# Patient Record
Sex: Female | Born: 1965 | Race: Black or African American | Hispanic: No | Marital: Married | Smoking: Never smoker
Health system: Southern US, Community
[De-identification: ages and names within clinical notes are randomized; demographics above are authoritative.]

## PROBLEM LIST (undated history)

## (undated) DIAGNOSIS — F419 Anxiety disorder, unspecified: Secondary | ICD-10-CM

## (undated) DIAGNOSIS — D649 Anemia, unspecified: Secondary | ICD-10-CM

## (undated) DIAGNOSIS — T7840XA Allergy, unspecified, initial encounter: Secondary | ICD-10-CM

## (undated) HISTORY — DX: Anemia, unspecified: D64.9

## (undated) HISTORY — DX: Allergy, unspecified, initial encounter: T78.40XA

## (undated) HISTORY — DX: Anxiety disorder, unspecified: F41.9

## (undated) HISTORY — PX: ABDOMINAL HYSTERECTOMY: SHX81

---

## 1999-05-14 HISTORY — PX: GASTRIC BYPASS: SHX52

## 2013-11-11 DIAGNOSIS — I10 Essential (primary) hypertension: Secondary | ICD-10-CM | POA: Insufficient documentation

## 2014-03-29 ENCOUNTER — Ambulatory Visit: Payer: Self-pay | Admitting: Family Medicine

## 2014-03-29 LAB — CBC WITH DIFFERENTIAL/PLATELET
Basophil #: 0 10*3/uL (ref 0.0–0.1)
Basophil %: 0.6 %
Eosinophil #: 0.1 10*3/uL (ref 0.0–0.7)
Eosinophil %: 1.8 %
HCT: 41.5 % (ref 35.0–47.0)
HGB: 13.5 g/dL (ref 12.0–16.0)
Lymphocyte #: 2.2 10*3/uL (ref 1.0–3.6)
Lymphocyte %: 28 %
MCH: 30 pg (ref 26.0–34.0)
MCHC: 32.6 g/dL (ref 32.0–36.0)
MCV: 92 fL (ref 80–100)
MONO ABS: 0.6 x10 3/mm (ref 0.2–0.9)
MONOS PCT: 8.2 %
Neutrophil #: 4.7 10*3/uL (ref 1.4–6.5)
Neutrophil %: 61.4 %
PLATELETS: 291 10*3/uL (ref 150–440)
RBC: 4.51 10*6/uL (ref 3.80–5.20)
RDW: 13.2 % (ref 11.5–14.5)
WBC: 7.7 10*3/uL (ref 3.6–11.0)

## 2014-03-29 LAB — LIPID PANEL
CHOLESTEROL: 145 mg/dL (ref 0–200)
HDL Cholesterol: 58 mg/dL (ref 40–60)
Ldl Cholesterol, Calc: 77 mg/dL (ref 0–100)
Triglycerides: 50 mg/dL (ref 0–200)
VLDL Cholesterol, Calc: 10 mg/dL (ref 5–40)

## 2014-08-29 DIAGNOSIS — R002 Palpitations: Secondary | ICD-10-CM | POA: Insufficient documentation

## 2014-09-06 ENCOUNTER — Ambulatory Visit
Admit: 2014-09-06 | Disposition: A | Discharge status: Home / Self Care | Payer: Self-pay | Attending: Bariatrics | Admitting: Bariatrics

## 2014-10-26 ENCOUNTER — Other Ambulatory Visit: Payer: Self-pay | Admitting: Bariatrics

## 2014-11-03 ENCOUNTER — Ambulatory Visit
Admission: RE | Admit: 2014-11-03 | Discharge: 2014-11-03 | Disposition: A | Discharge status: Home / Self Care | Payer: BLUE CROSS/BLUE SHIELD | Source: Ambulatory Visit | Attending: Bariatrics | Admitting: Bariatrics

## 2014-11-03 DIAGNOSIS — K219 Gastro-esophageal reflux disease without esophagitis: Secondary | ICD-10-CM | POA: Insufficient documentation

## 2014-11-03 DIAGNOSIS — Z9884 Bariatric surgery status: Secondary | ICD-10-CM | POA: Insufficient documentation

## 2014-11-24 ENCOUNTER — Ambulatory Visit (INDEPENDENT_AMBULATORY_CARE_PROVIDER_SITE_OTHER): Payer: BLUE CROSS/BLUE SHIELD | Admitting: Family Medicine

## 2014-11-24 ENCOUNTER — Encounter: Payer: Self-pay | Admitting: Family Medicine

## 2014-11-24 VITALS — BP 110/60 | HR 95 | Temp 98.8°F | Resp 17 | Ht 65.0 in | Wt 240.7 lb

## 2014-11-24 DIAGNOSIS — F419 Anxiety disorder, unspecified: Secondary | ICD-10-CM

## 2014-11-24 DIAGNOSIS — F411 Generalized anxiety disorder: Secondary | ICD-10-CM

## 2014-11-24 DIAGNOSIS — R011 Cardiac murmur, unspecified: Secondary | ICD-10-CM | POA: Insufficient documentation

## 2014-11-24 DIAGNOSIS — F430 Acute stress reaction: Principal | ICD-10-CM | POA: Insufficient documentation

## 2014-11-24 MED ORDER — CLONAZEPAM 0.5 MG PO TABS: 0.2500 mg | ORAL_TABLET | Freq: Two times a day (BID) | ORAL | Status: DC | PRN

## 2014-11-24 NOTE — Progress Notes (Signed)
Name: Consepcion Hearingunice Denise Whitaker   MRN: [id number]    DOB: January 26, 1966   Date:11/24/2014       Progress Note  Subjective  Chief Complaint  Chief Complaint  Patient presents with  . Follow-up    3 mo.  . Anxiety    Anxiety Presents for follow-up visit. Onset was 1 to 6 months ago. Symptoms include excessive worry and nervous/anxious behavior. Patient reports no chest pain, insomnia, irritability, panic or shortness of breath. Primary symptoms comment: started after daughter passed away. The severity of symptoms is mild. The symptoms are aggravated by family issues. The quality of sleep is fair.   Risk factors include a major life event. Past treatments include benzodiazephines. The treatment provided significant relief. Compliance with prior treatments has been good.      Past Medical History  Diagnosis Date  . Allergy   . Anemia   . Anxiety     Past Surgical History  Procedure Laterality Date  . Abdominal hysterectomy    . Cesarean section  1997  . Gastric bypass  2001    Family History  Problem Relation Age of Onset  . Hypertension Mother   . Multiple sclerosis Mother   . Hypertension Father   . Cancer Father   . Multiple sclerosis Sister   . Hypertension Sister   . Hypertension Brother     History   Social History  . Marital Status: Married    Spouse Name: N/A  . Number of Children: N/A  . Years of Education: N/A   Occupational History  . Not on file.   Social History Main Topics  . Smoking status: Never Smoker   . Smokeless tobacco: Never Used  . Alcohol Use: No  . Drug Use: No  . Sexual Activity:    Partners: Male    Birth Control/ Protection: None     Comment: Married   Other Topics Concern  . Not on file   Social History Narrative  . No narrative on file     Current outpatient prescriptions:  .  IRON, IRON,, Take by mouth., Disp: , Rfl:  .  Pediatric Multiple Vitamins (CHEWABLE MULTIPLE VITAMINS PO), Take by mouth., Disp: , Rfl:   Allergies   Allergen Reactions  . Aspirin Hives and Nausea And Vomiting     Review of Systems  Constitutional: Negative for irritability.  Respiratory: Negative for shortness of breath.   Cardiovascular: Negative for chest pain.  Psychiatric/Behavioral: Negative for depression. The patient is nervous/anxious. The patient does not have insomnia.       Objective  Filed Vitals:   11/24/14 1614  BP: 110/60  Pulse: 95  Temp: 98.8 F (37.1 C)  TempSrc: Oral  Resp: 17  Height: 5\' 5"  (1.651 m)  Weight: 240 lb 11.2 oz (109.181 kg)  SpO2: 99%    Physical Exam  Constitutional: She is oriented to person, place, and time and well-developed, well-nourished, and in no distress.  Cardiovascular: Normal rate, regular rhythm, S1 normal and S2 normal.   Murmur heard.  Systolic murmur is present with a grade of 3/6  Pulmonary/Chest: Effort normal and breath sounds normal.  Neurological: She is alert and oriented to person, place, and time.  Skin: Skin is warm and dry.  Psychiatric: Mood, memory, affect and judgment normal.  Nursing note and vitals reviewed.     Assessment & Plan 1. Anxiety as acute reaction to exceptional stress Symptoms of anxiety are stable on as needed benzodiazepine therapy. Patient is aware of  the dependence potential of benzodiazepines and is taking the medication as directed and only as needed. Refills provided. Follow-up in 3 months. - clonazePAM (KLONOPIN) 0.5 MG tablet; Take 0.5 tablets (0.25 mg total) by mouth 2 (two) times daily as needed for anxiety.  Dispense: 30 tablet; Refill: 0   Jodel Mayhall Asad A[facility] 11/24/2014 4:41 PM

## 2015-02-24 ENCOUNTER — Ambulatory Visit: Payer: BLUE CROSS/BLUE SHIELD | Admitting: Family Medicine

## 2015-03-06 ENCOUNTER — Ambulatory Visit
Admission: RE | Admit: 2015-03-06 | Discharge: 2015-03-06 | Disposition: A | Discharge status: Home / Self Care | Payer: BLUE CROSS/BLUE SHIELD | Source: Ambulatory Visit | Attending: Family Medicine | Admitting: Family Medicine

## 2015-03-06 ENCOUNTER — Encounter: Payer: Self-pay | Admitting: Family Medicine

## 2015-03-06 ENCOUNTER — Ambulatory Visit (INDEPENDENT_AMBULATORY_CARE_PROVIDER_SITE_OTHER): Payer: BLUE CROSS/BLUE SHIELD | Admitting: Family Medicine

## 2015-03-06 VITALS — BP 112/70 | HR 82 | Temp 98.0°F | Resp 16 | Wt 241.5 lb

## 2015-03-06 DIAGNOSIS — E668 Other obesity: Secondary | ICD-10-CM | POA: Insufficient documentation

## 2015-03-06 DIAGNOSIS — M1711 Unilateral primary osteoarthritis, right knee: Secondary | ICD-10-CM | POA: Insufficient documentation

## 2015-03-06 DIAGNOSIS — I10 Essential (primary) hypertension: Secondary | ICD-10-CM | POA: Insufficient documentation

## 2015-03-06 DIAGNOSIS — F419 Anxiety disorder, unspecified: Secondary | ICD-10-CM | POA: Diagnosis not present

## 2015-03-06 DIAGNOSIS — M25461 Effusion, right knee: Secondary | ICD-10-CM | POA: Diagnosis not present

## 2015-03-06 DIAGNOSIS — Z01419 Encounter for gynecological examination (general) (routine) without abnormal findings: Secondary | ICD-10-CM | POA: Insufficient documentation

## 2015-03-06 DIAGNOSIS — M25561 Pain in right knee: Secondary | ICD-10-CM

## 2015-03-06 DIAGNOSIS — Z1389 Encounter for screening for other disorder: Secondary | ICD-10-CM | POA: Insufficient documentation

## 2015-03-06 DIAGNOSIS — E669 Obesity, unspecified: Secondary | ICD-10-CM | POA: Insufficient documentation

## 2015-03-06 DIAGNOSIS — I499 Cardiac arrhythmia, unspecified: Secondary | ICD-10-CM | POA: Insufficient documentation

## 2015-03-06 MED ORDER — CLONAZEPAM 0.5 MG PO TABS: 0.2500 mg | ORAL_TABLET | Freq: Two times a day (BID) | ORAL | Status: DC | PRN

## 2015-03-06 MED ORDER — SERTRALINE HCL 25 MG PO TABS: 25.0000 mg | ORAL_TABLET | Freq: Every day | ORAL | Status: DC

## 2015-03-06 NOTE — Progress Notes (Signed)
Name: Tiffany Whitaker   MRN: [id number]    DOB: 1965-08-04   Date:03/06/2015       Progress Note  Subjective  Chief Complaint  Chief Complaint  Patient presents with  . Anxiety    patient is here for her 80-month follow-up  . Knee Pain    patient also mentioned about bilateral knee pain for 3 weeks. patient stated that she has been hearing some popping.    Anxiety Presents for follow-up visit. Symptoms include insomnia and nervous/anxious behavior. Patient reports no depressed mood. Primary symptoms comment: crying, . The symptoms are aggravated by family issues.   There is no history of depression. (Pt. 's young daughter passed away in Oct 06, 2022 and she has been feeling sad and grieving for her child) Past treatments include benzodiazephines. Compliance with prior treatments has been good (pt. has good days and bad days, she takes Clonazepam on bad days).  Knee Pain  There was no injury mechanism. The pain is present in the right knee. The quality of the pain is described as aching. The pain is at a severity of 10/10. The pain is severe. Pertinent negatives include no inability to bear weight, loss of motion or loss of sensation. The symptoms are aggravated by weight bearing (hurts at night when she lies down). She has tried ice and NSAIDs for the symptoms. The treatment provided moderate relief.    Past Medical History  Diagnosis Date  . Allergy   . Anemia   . Anxiety     Past Surgical History  Procedure Laterality Date  . Abdominal hysterectomy    . Cesarean section  1997  . Gastric bypass  10/06/1999    Family History  Problem Relation Age of Onset  . Hypertension Mother   . Multiple sclerosis Mother   . Hypertension Father   . Cancer Father   . Multiple sclerosis Sister   . Hypertension Sister   . Hypertension Brother     Social History   Social History  . Marital Status: Married    Spouse Name: N/A  . Number of Children: N/A  . Years of Education: N/A    Occupational History  . Not on file.   Social History Main Topics  . Smoking status: Never Smoker   . Smokeless tobacco: Never Used  . Alcohol Use: No  . Drug Use: No  . Sexual Activity:    Partners: Male    Birth Control/ Protection: None     Comment: Married   Other Topics Concern  . Not on file   Social History Narrative    Current outpatient prescriptions:  .  clonazePAM (KLONOPIN) 0.5 MG tablet, Take 0.5 tablets (0.25 mg total) by mouth 2 (two) times daily as needed for anxiety., Disp: 30 tablet, Rfl: 0 .  IRON, IRON,, Take by mouth., Disp: , Rfl:  .  Pediatric Multiple Vitamins (CHEWABLE MULTIPLE VITAMINS PO), Take by mouth., Disp: , Rfl:   Allergies  Allergen Reactions  . Aspirin Hives and Nausea And Vomiting     Review of Systems  Constitutional: Negative for fever, chills and weight loss.  Musculoskeletal: Positive for joint pain.  Psychiatric/Behavioral: Negative for depression. The patient is nervous/anxious and has insomnia.    Objective  Filed Vitals:   03/06/15 1117  BP: 112/70  Pulse: 82  Temp: 98 F (36.7 C)  TempSrc: Oral  Resp: 16  Weight: 241 lb 8 oz (109.544 kg)  SpO2: 98%    Physical Exam  Constitutional: She  is oriented to person, place, and time and well-developed, well-nourished, and in no distress.  Cardiovascular: Normal rate and regular rhythm.   Murmur heard.  Systolic murmur is present with a grade of 3/6  Pulmonary/Chest: Effort normal and breath sounds normal. She has no wheezes. She has no rales.  Musculoskeletal:       Right knee: She exhibits normal range of motion and no swelling. Tenderness found.  Tenderness over the superior portion of right knee  Neurological: She is alert and oriented to person, place, and time.  Nursing note and vitals reviewed.  Assessment & Plan  1. Right knee pain  - DG Knee Complete 4 Views Right; Future  2. Anxiety Patient leaving for her young daughter who recently passed away. We  will add low-dose Zoloft and continue clonazepam. Follow-up in 4 weeks. - clonazePAM (KLONOPIN) 0.5 MG tablet; Take 0.5 tablets (0.25 mg total) by mouth 2 (two) times daily as needed for anxiety.  Dispense: 30 tablet; Refill: 0 - sertraline (ZOLOFT) 25 MG tablet; Take 1 tablet (25 mg total) by mouth daily.  Dispense: 30 tablet; Refill: 0   Nevaeh Casillas Asad A. [facility] 03/06/2015 12:00 PM

## 2015-03-09 ENCOUNTER — Other Ambulatory Visit
Admission: RE | Admit: 2015-03-09 | Discharge: 2015-03-09 | Disposition: A | Discharge status: Home / Self Care | Payer: BLUE CROSS/BLUE SHIELD | Source: Ambulatory Visit | Attending: Bariatrics | Admitting: Bariatrics

## 2015-03-09 DIAGNOSIS — R1010 Upper abdominal pain, unspecified: Secondary | ICD-10-CM | POA: Diagnosis not present

## 2015-03-09 DIAGNOSIS — I10 Essential (primary) hypertension: Secondary | ICD-10-CM | POA: Insufficient documentation

## 2015-03-09 DIAGNOSIS — D649 Anemia, unspecified: Secondary | ICD-10-CM | POA: Insufficient documentation

## 2015-03-09 LAB — CBC WITH DIFFERENTIAL/PLATELET
BASOS ABS: 0 10*3/uL (ref 0–0.1)
BASOS PCT: 1 %
EOS ABS: 0.2 10*3/uL (ref 0–0.7)
EOS PCT: 2 %
HEMATOCRIT: 38.8 % (ref 35.0–47.0)
Hemoglobin: 12.8 g/dL (ref 12.0–16.0)
Lymphocytes Relative: 26 %
Lymphs Abs: 2.6 10*3/uL (ref 1.0–3.6)
MCH: 29.1 pg (ref 26.0–34.0)
MCHC: 32.9 g/dL (ref 32.0–36.0)
MCV: 88.4 fL (ref 80.0–100.0)
MONO ABS: 1 10*3/uL — AB (ref 0.2–0.9)
MONOS PCT: 10 %
NEUTROS ABS: 6.3 10*3/uL (ref 1.4–6.5)
Neutrophils Relative %: 61 %
PLATELETS: 260 10*3/uL (ref 150–440)
RBC: 4.39 MIL/uL (ref 3.80–5.20)
RDW: 13.4 % (ref 11.5–14.5)
WBC: 10.1 10*3/uL (ref 3.6–11.0)

## 2015-03-09 LAB — COMPREHENSIVE METABOLIC PANEL
ALBUMIN: 3.5 g/dL (ref 3.5–5.0)
ALT: 18 U/L (ref 14–54)
ANION GAP: 5 (ref 5–15)
AST: 17 U/L (ref 15–41)
Alkaline Phosphatase: 88 U/L (ref 38–126)
BUN: 20 mg/dL (ref 6–20)
CHLORIDE: 105 mmol/L (ref 101–111)
CO2: 26 mmol/L (ref 22–32)
Calcium: 8.9 mg/dL (ref 8.9–10.3)
Creatinine, Ser: 0.55 mg/dL (ref 0.44–1.00)
GFR calc non Af Amer: >60 mL/min (ref >60)
GLUCOSE: 107 mg/dL — AB (ref 65–99)
POTASSIUM: 3.5 mmol/L (ref 3.5–5.1)
SODIUM: 136 mmol/L (ref 135–145)
Total Bilirubin: 0.6 mg/dL (ref 0.3–1.2)
Total Protein: 6.9 g/dL (ref 6.5–8.1)

## 2015-03-09 LAB — IRON AND TIBC
IRON: 62 ug/dL (ref 28–170)
Saturation Ratios: 19 % (ref 10.4–31.8)
TIBC: 335 ug/dL (ref 250–450)
UIBC: 273 ug/dL

## 2015-03-09 LAB — APTT: APTT: 32 s (ref 24–36)

## 2015-03-09 LAB — PROTIME-INR
INR: 0.98
PROTHROMBIN TIME: 13.2 s (ref 11.4–15.0)

## 2015-03-09 LAB — FERRITIN: Ferritin: 52 ng/mL (ref 11–307)

## 2015-03-09 LAB — TSH: TSH: 1.866 u[IU]/mL (ref 0.350–4.500)

## 2015-03-09 LAB — PREALBUMIN: PREALBUMIN: 17.5 mg/dL — AB (ref 18–38)

## 2015-03-09 LAB — FOLATE: FOLATE: 23 ng/mL (ref >5.9)

## 2015-03-09 LAB — MAGNESIUM: Magnesium: 1.8 mg/dL (ref 1.7–2.4)

## 2015-03-09 LAB — VITAMIN B12: VITAMIN B 12: 592 pg/mL (ref 180–914)

## 2015-03-10 LAB — H. PYLORI ANTIBODY, IGG: H Pylori IgG: 1.5 U/mL — ABNORMAL HIGH (ref 0.0–0.8)

## 2015-03-10 LAB — HEMOGLOBIN A1C: Hgb A1c MFr Bld: 6.3 % — ABNORMAL HIGH (ref 4.0–6.0)

## 2015-03-12 LAB — VITAMIN A: VITAMIN A (RETINOIC ACID): 30 ug/dL (ref 20–65)

## 2015-03-12 LAB — COPPER, SERUM: Copper: 133 ug/dL (ref 72–166)

## 2015-03-12 LAB — PTH, INTACT AND CALCIUM
Calcium, Total (PTH): 9.2 mg/dL (ref 8.7–10.2)
PTH: 38 pg/mL (ref 15–65)

## 2015-03-12 LAB — ZINC: ZINC: 70 ug/dL (ref 56–134)

## 2015-03-12 LAB — VITAMIN D 25 HYDROXY (VIT D DEFICIENCY, FRACTURES): Vit D, 25-Hydroxy: 25.4 ng/mL — ABNORMAL LOW (ref 30.0–100.0)

## 2015-03-12 LAB — VITAMIN E: Alpha-Tocopherol: 8.5 mg/L (ref 5.3–16.8)

## 2015-03-12 LAB — VITAMIN B1: Vitamin B1 (Thiamine): 109.4 nmol/L (ref 66.5–200.0)

## 2015-03-15 LAB — H. PYLORI BREATH TEST: H. pylori UBiT: POSITIVE — AB

## 2015-03-15 LAB — MISC LABCORP TEST (SEND OUT): Labcorp test code: 121200

## 2015-03-16 ENCOUNTER — Encounter: Payer: Self-pay | Admitting: Family Medicine

## 2015-03-16 ENCOUNTER — Ambulatory Visit (INDEPENDENT_AMBULATORY_CARE_PROVIDER_SITE_OTHER): Payer: BLUE CROSS/BLUE SHIELD | Admitting: Family Medicine

## 2015-03-16 VITALS — BP 115/70 | HR 93 | Temp 98.8°F | Resp 19 | Ht 65.0 in | Wt 239.8 lb

## 2015-03-16 DIAGNOSIS — R29818 Other symptoms and signs involving the nervous system: Secondary | ICD-10-CM | POA: Diagnosis not present

## 2015-03-16 NOTE — Progress Notes (Signed)
Name: Tiffany Whitaker   MRN: [id number]    DOB: 1966-01-15   Date:03/16/2015       Progress Note  Subjective  Chief Complaint  Chief Complaint  Patient presents with  . Follow-up    11 day check for MS symptoms    HPI  Pt. Is here for evaluation of symptoms that she thinks are from 'MS'. Her sister was recently diagnosed with MS, and pt. Is worried about some symptoms that she has experienced which has caused her to be concerned about MS. These include difficulty with balance, tingling in legs especially when driving, unusual sensation in her thighs, numbness in hands. Most recently, she has noticed that when she closes her right eye and uses her left eye only, she sees 'smokiness' These symptoms are transient (None present at this time).  Past Medical History  Diagnosis Date  . Allergy   . Anemia   . Anxiety     Past Surgical History  Procedure Laterality Date  . Abdominal hysterectomy    . Cesarean section  1997  . Gastric bypass  2001    Family History  Problem Relation Age of Onset  . Hypertension Mother   . Multiple sclerosis Mother   . Hypertension Father   . Cancer Father   . Multiple sclerosis Sister   . Hypertension Sister   . Hypertension Brother     Social History   Social History  . Marital Status: Married    Spouse Name: N/A  . Number of Children: N/A  . Years of Education: N/A   Occupational History  . Not on file.   Social History Main Topics  . Smoking status: Never Smoker   . Smokeless tobacco: Never Used  . Alcohol Use: No  . Drug Use: No  . Sexual Activity:    Partners: Male    Birth Control/ Protection: None     Comment: Married   Other Topics Concern  . Not on file   Social History Narrative     Current outpatient prescriptions:  .  clonazePAM (KLONOPIN) 0.5 MG tablet, Take 0.5 tablets (0.25 mg total) by mouth 2 (two) times daily as needed for anxiety., Disp: 30 tablet, Rfl: 0 .  IRON, IRON,, Take by mouth., Disp: , Rfl:   .  Pediatric Multiple Vitamins (CHEWABLE MULTIPLE VITAMINS PO), Take by mouth., Disp: , Rfl:  .  sertraline (ZOLOFT) 25 MG tablet, Take 1 tablet (25 mg total) by mouth daily., Disp: 30 tablet, Rfl: 0  Allergies  Allergen Reactions  . Aspirin Hives and Nausea And Vomiting    Review of Systems  Constitutional: Negative for fever, chills, weight loss and malaise/fatigue.  HENT: Negative for hearing loss and tinnitus.   Eyes: Positive for blurred vision. Negative for double vision.  Cardiovascular: Negative for chest pain and palpitations.  Musculoskeletal: Negative for back pain and neck pain.  Neurological: Positive for dizziness, tingling and sensory change. Negative for focal weakness, loss of consciousness and headaches.  Psychiatric/Behavioral: Positive for depression (' i dont think i m depressed'). The patient is nervous/anxious.     Objective  Filed Vitals:   03/16/15 1556  BP: 115/70  Pulse: 93  Temp: 98.8 F (37.1 C)  TempSrc: Oral  Resp: 19  Height:  (1.651 m)  Weight: 239 lb 12.8 oz (108.773 kg)  SpO2: 98%    Physical Exam  Constitutional: She is oriented to person, place, and time and well-developed, well-nourished, and in no distress.  HENT:  Head: Normocephalic and atraumatic.  Cardiovascular: Normal rate, regular rhythm and normal heart sounds.   Pulmonary/Chest: Effort normal and breath sounds normal.  Neurological: She is alert and oriented to person, place, and time. She has normal motor skills, normal sensation, normal strength and intact cranial nerves. She displays no weakness, normal speech and normal reflexes. No cranial nerve deficit or sensory deficit. She exhibits normal muscle tone. Coordination and gait normal.  Reflex Scores:      Patellar reflexes are 1+ on the right side and 1+ on the left side. Psychiatric: Memory, affect and judgment normal.  Nursing note and vitals reviewed.   Assessment & Plan  1. Transient neurological  symptoms Unremarkable physical exam except for mildly sluggish patellar reflexes. I have explained to the patient that I do not believe she has MS and testing with MRI is not indicated at this time. We'll obtain lab lab work and refer to neurology for further evaluation. Patient verbalized understanding. - COMPLETE METABOLIC PANEL WITH GFR - TSH - B12 - CBC with Differential - Ambulatory referral to Neurology   [facility] Asad [facility] 03/16/2015 4:22 PM

## 2015-04-18 ENCOUNTER — Other Ambulatory Visit: Payer: Self-pay | Admitting: Bariatrics

## 2015-08-16 ENCOUNTER — Ambulatory Visit
Admission: RE | Admit: 2015-08-16 | Discharge: 2015-08-16 | Disposition: A | Discharge status: Home / Self Care | Payer: BLUE CROSS/BLUE SHIELD | Source: Ambulatory Visit | Attending: Bariatrics | Admitting: Bariatrics

## 2016-05-28 ENCOUNTER — Ambulatory Visit (INDEPENDENT_AMBULATORY_CARE_PROVIDER_SITE_OTHER): Payer: Managed Care, Other (non HMO) | Admitting: Family Medicine

## 2016-05-28 ENCOUNTER — Encounter: Payer: Self-pay | Admitting: Family Medicine

## 2016-05-28 VITALS — BP 110/70 | HR 64 | Temp 98.2°F | Resp 18 | Ht 65.0 in | Wt 222.8 lb

## 2016-05-28 DIAGNOSIS — R0982 Postnasal drip: Secondary | ICD-10-CM | POA: Diagnosis not present

## 2016-05-28 DIAGNOSIS — J329 Chronic sinusitis, unspecified: Secondary | ICD-10-CM | POA: Insufficient documentation

## 2016-05-28 DIAGNOSIS — R05 Cough: Secondary | ICD-10-CM | POA: Diagnosis not present

## 2016-05-28 DIAGNOSIS — M1711 Unilateral primary osteoarthritis, right knee: Secondary | ICD-10-CM | POA: Diagnosis not present

## 2016-05-28 DIAGNOSIS — R058 Other specified cough: Secondary | ICD-10-CM

## 2016-05-28 MED ORDER — TRAMADOL HCL 50 MG PO TABS: 50.0000 mg | ORAL_TABLET | Freq: Every evening | ORAL | 0 refills | Status: DC | PRN

## 2016-05-28 MED ORDER — AZITHROMYCIN 250 MG PO TABS: ORAL_TABLET | ORAL | 0 refills | Status: DC

## 2016-05-28 MED ORDER — FLUTICASONE PROPIONATE 50 MCG/ACT NA SUSP: 2.0000 | Freq: Every day | NASAL | 0 refills | Status: DC

## 2016-05-28 NOTE — Progress Notes (Signed)
Name: Tiffany Whitaker   MRN: [id number]    DOB: Mar 29, 1966   Date:05/28/2016       Progress Note  Subjective  Chief Complaint  Chief Complaint  Patient presents with  . Cough    No taste or smell  . Knee Pain    follow up right knee pain worse at night    Cough  This is a chronic problem. The current episode started more than 1 month ago (since September 2017). The problem has been unchanged. The cough is productive of sputum. Associated symptoms include nasal congestion and postnasal drip. Pertinent negatives include no chills, fever, headaches, sore throat or shortness of breath. Associated symptoms comments: Intermittent but persistent cough, sometimes can be severe, drainage at night, nauseated, feels like she has lost her voice. Has also lost her sense to taste and smell. She has tried OTC cough suppressant (Mucinex PRN, Coricidin) for the symptoms.   Right Knee Pain: Pt. Has persistently worsening right knee pain, worse at night to the point that she has to wake up several times at night, also worse when she tries to stand up and starts walking after being in a seated position.  X rays obtained 2 years ago showed mild degenerative changes in the right knee, she has been taking Ibuprofen and Aleve which are not working.  Pain is achy in character, feels like the pain is coming from the medial knee and almost feels as if there is fluid in her knee.  Past Medical History:  Diagnosis Date  . Allergy   . Anemia   . Anxiety     Past Surgical History:  Procedure Laterality Date  . ABDOMINAL HYSTERECTOMY    . CESAREAN SECTION  1997  . GASTRIC BYPASS  2001    Family History  Problem Relation Age of Onset  . Hypertension Mother   . Multiple sclerosis Mother   . Hypertension Father   . Cancer Father   . Multiple sclerosis Sister   . Hypertension Sister   . Hypertension Brother     Social History   Social History  . Marital status: Married    Spouse name: N/A  .  Number of children: N/A  . Years of education: N/A   Occupational History  . Not on file.   Social History Main Topics  . Smoking status: Never Smoker  . Smokeless tobacco: Never Used  . Alcohol use No  . Drug use: No  . Sexual activity: Yes    Partners: Male    Birth control/ protection: None     Comment: Married   Other Topics Concern  . Not on file   Social History Narrative  . No narrative on file     Current Outpatient Prescriptions:  .  Pediatric Multiple Vitamins (CHEWABLE MULTIPLE VITAMINS PO), Take by mouth., Disp: , Rfl:   Allergies  Allergen Reactions  . Aspirin Hives and Nausea And Vomiting     Review of Systems  Constitutional: Positive for malaise/fatigue. Negative for chills and fever.  HENT: Positive for congestion, postnasal drip and sinus pain. Negative for sore throat.   Respiratory: Positive for cough and sputum production. Negative for shortness of breath.   Musculoskeletal: Positive for joint pain.  Neurological: Negative for headaches.     Objective  Vitals:   05/28/16 1049  BP: 110/70  Pulse: 64  Resp: 18  Temp: 98.2 F (36.8 C)  TempSrc: Oral  SpO2: 98%  Weight: 222 lb 12.8 oz (101.1 kg)  Height: 5\' 5"  (1.651 m)    Physical Exam  Constitutional: She is well-developed, well-nourished, and in no distress.  HENT:  Right Ear: Tympanic membrane and ear canal normal. No drainage or swelling.  Left Ear: Tympanic membrane and ear canal normal. No drainage or swelling.  Nose: Right sinus exhibits no maxillary sinus tenderness and no frontal sinus tenderness. Left sinus exhibits no maxillary sinus tenderness and no frontal sinus tenderness.  Mouth/Throat: No oropharyngeal exudate, posterior oropharyngeal edema or posterior oropharyngeal erythema.  Nasal mucosal inflammation and turbinate hypertrophy  Cardiovascular: Normal rate, regular rhythm, S1 normal and S2 normal.   Murmur heard.  Systolic murmur is present with a grade of 2/6   Pulmonary/Chest: Effort normal and breath sounds normal. No respiratory distress. She has no wheezes. She has no rhonchi.  Musculoskeletal:       Right knee: She exhibits swelling. Tenderness found. Medial joint line tenderness noted.       Legs: Nursing note and vitals reviewed.       Assessment & Plan  1. Recurrent dry cough  - azithromycin (ZITHROMAX) 250 MG tablet; 2 tabs po day 1, then 1 tab po q day x 4 days  Dispense: 6 tablet; Refill: 0  2. Arthritis of right knee  - traMADol (ULTRAM) 50 MG tablet; Take 1 tablet (50 mg total) by mouth at bedtime as needed.  Dispense: 30 tablet; Refill: 0  3. Post-nasal drainage  - fluticasone (FLONASE) 50 MCG/ACT nasal spray; Place 2 sprays into both nostrils daily.  Dispense: 16 g; Refill: 0   Syed Asad A. [facility] 05/28/2016 10:54 AM

## 2016-08-26 ENCOUNTER — Ambulatory Visit (INDEPENDENT_AMBULATORY_CARE_PROVIDER_SITE_OTHER): Payer: Managed Care, Other (non HMO) | Admitting: Family Medicine

## 2016-08-26 ENCOUNTER — Encounter: Payer: Self-pay | Admitting: Family Medicine

## 2016-08-26 VITALS — BP 115/69 | HR 89 | Temp 97.9°F | Resp 16 | Ht 65.0 in | Wt 210.1 lb

## 2016-08-26 DIAGNOSIS — R058 Other specified cough: Secondary | ICD-10-CM

## 2016-08-26 DIAGNOSIS — R05 Cough: Secondary | ICD-10-CM | POA: Diagnosis not present

## 2016-08-26 DIAGNOSIS — M1711 Unilateral primary osteoarthritis, right knee: Secondary | ICD-10-CM

## 2016-08-26 MED ORDER — TRAMADOL HCL 50 MG PO TABS: 50.0000 mg | ORAL_TABLET | Freq: Every evening | ORAL | 0 refills | Status: DC | PRN

## 2016-08-26 NOTE — Progress Notes (Signed)
Name: Tiffany Whitaker   MRN: [id number]    DOB: [birth year]   Date:08/26/2016       Progress Note  Subjective  Chief Complaint  Chief Complaint  Patient presents with  . Follow-up    3 mo  . Medication Refill  . Cough    Cough  This is a recurrent problem. The current episode started more than 1 month ago (approximately 3 months ago). The cough is productive of sputum. Associated symptoms include headaches. Pertinent negatives include no chest pain, chills, ear pain, fever, nasal congestion, sore throat or shortness of breath. Treatments tried: has taken 2 different Abx, one from [place] and the second one from Urgent Care where she was seen for the flu. The treatment provided no relief. There is no history of asthma, bronchitis or COPD.  Knee Pain   There was no injury mechanism. The pain is present in the right knee. The quality of the pain is described as shooting. The pain is at a severity of 3/10. The pain has been fluctuating (worse at night) since onset. Treatments tried: Has been taking Tramadol 50 mg every day as needed.     Past Medical History:  Diagnosis Date  . Allergy   . Anemia   . Anxiety     Past Surgical History:  Procedure Laterality Date  . ABDOMINAL HYSTERECTOMY    . CESAREAN SECTION  1997  . GASTRIC BYPASS  2001    Family History  Problem Relation Age of Onset  . Hypertension Mother   . Multiple sclerosis Mother   . Hypertension Father   . Cancer Father   . Multiple sclerosis Sister   . Hypertension Sister   . Hypertension Brother     Social History   Social History  . Marital status: Married    Spouse name: N/A  . Number of children: N/A  . Years of education: N/A   Occupational History  . Not on file.   Social History Main Topics  . Smoking status: Never Smoker  . Smokeless tobacco: Never Used  . Alcohol use No  . Drug use: No  . Sexual activity: Yes    Partners: Male    Birth control/ protection: None     Comment: Married    Other Topics Concern  . Not on file   Social History Narrative  . No narrative on file     Current Outpatient Prescriptions:  .  Pediatric Multiple Vitamins (CHEWABLE MULTIPLE VITAMINS PO), Take by mouth., Disp: , Rfl:  .  traMADol (ULTRAM) 50 MG tablet, Take 1 tablet (50 mg total) by mouth at bedtime as needed., Disp: 30 tablet, Rfl: 0  Allergies  Allergen Reactions  . Aspirin Hives, Nausea And Vomiting and Swelling  . Other Hives and Swelling    "certain type of balloons"     Review of Systems  Constitutional: Negative for chills and fever.  HENT: Negative for ear pain and sore throat.   Respiratory: Positive for cough. Negative for shortness of breath.   Cardiovascular: Negative for chest pain and leg swelling.  Neurological: Positive for headaches.    Objective  Vitals:   08/26/16 1458  BP: 115/69  Pulse: 89  Resp: 16  Temp: 97.9 F (36.6 C)  TempSrc: Oral  SpO2: 96%  Weight: 210 lb 1.6 oz (95.3 kg)  Height:  (1.651 m)    Physical Exam  Constitutional: She is well-developed, well-nourished, and in no distress.  HENT:  Right Ear: Tympanic membrane  and ear canal normal.  Left Ear: Tympanic membrane and ear canal normal.  Nose: Right sinus exhibits no maxillary sinus tenderness. Left sinus exhibits no maxillary sinus tenderness.  Mouth/Throat: Posterior oropharyngeal erythema present. No oropharyngeal exudate.  Cardiovascular: Normal rate, regular rhythm, S1 normal and S2 normal.   Murmur heard.  Systolic murmur is present with a grade of 2/6  Pulmonary/Chest: Breath sounds normal. She has no decreased breath sounds. She has no wheezes. She has no rhonchi.  Musculoskeletal:       Right ankle: She exhibits no swelling.       Left ankle: She exhibits no swelling.  Psychiatric: Mood, memory, affect and judgment normal.  Nursing note and vitals reviewed.      Assessment & Plan  1. Arthritis of right knee X-ray from 2016 reviewed, shows mild  degenerative changes and a small effusion, pain relieved with tramadol taken every day as needed, refills provided - traMADol (ULTRAM) 50 MG tablet; Take 1 tablet (50 mg total) by mouth at bedtime as needed.  Dispense: 30 tablet; Refill: 0  2. Cough present for greater than 3 weeks Recurrent, has taken 2 rounds of antimicrobials, if cough does not improve and chest x-ray is unremarkable, consider referral to ENT - DG Chest 2 View; Future   Jahaad Penado Asad [facility] 08/26/2016 3:45 PM

## 2016-08-27 ENCOUNTER — Ambulatory Visit
Admission: RE | Admit: 2016-08-27 | Discharge: 2016-08-27 | Disposition: A | Discharge status: Home / Self Care | Payer: Managed Care, Other (non HMO) | Source: Ambulatory Visit | Attending: Family Medicine | Admitting: Family Medicine

## 2016-08-27 DIAGNOSIS — R058 Other specified cough: Secondary | ICD-10-CM

## 2016-08-27 DIAGNOSIS — R05 Cough: Secondary | ICD-10-CM | POA: Insufficient documentation

## 2016-08-30 ENCOUNTER — Telehealth: Payer: Self-pay | Admitting: Family Medicine

## 2016-08-30 NOTE — Telephone Encounter (Signed)
Pt would like a call back about her results. Please call her back.

## 2016-09-02 ENCOUNTER — Telehealth: Payer: Self-pay | Admitting: Emergency Medicine

## 2016-09-02 NOTE — Telephone Encounter (Signed)
Patient stated her CXR came back normal and she still has a cough. Will you please call prescription in for cough. [facility]

## 2016-09-02 NOTE — Telephone Encounter (Signed)
Patient has been notified of lab results  

## 2016-09-03 NOTE — Telephone Encounter (Signed)
Please start this patient on Tessalon Perles 100 mg by mouth 3 times a day 7 days, #21 #0 refills to her pharmacy, if cough does not improve after the 7 day course of Tessalon Perles, she should be referred to ENT.

## 2016-09-04 ENCOUNTER — Other Ambulatory Visit: Payer: Self-pay | Admitting: Emergency Medicine

## 2016-09-04 DIAGNOSIS — R05 Cough: Secondary | ICD-10-CM

## 2016-09-04 DIAGNOSIS — R059 Cough, unspecified: Secondary | ICD-10-CM

## 2016-09-04 MED ORDER — BENZONATATE 100 MG PO CAPS: 100.0000 mg | ORAL_CAPSULE | Freq: Three times a day (TID) | ORAL | 0 refills | Status: AC

## 2016-09-04 NOTE — Telephone Encounter (Signed)
Script sent to pharmacy, patient phone is unavailable to leave message, order put in for ENT. Will continue to try patient

## 2016-09-10 ENCOUNTER — Encounter: Payer: Managed Care, Other (non HMO) | Admitting: Family Medicine

## 2016-09-10 ENCOUNTER — Telehealth: Payer: Self-pay | Admitting: Family Medicine

## 2016-09-10 NOTE — Telephone Encounter (Signed)
Pt says that the pearls that you gave her for cough has not done any good and she is wanting to know if you could call her in something at [facility] in [place]  for her cough till they call her about an appt at the ENT you referred her to.

## 2016-09-10 NOTE — Telephone Encounter (Signed)
Please confirm with patient regarding the exact date of her appointment with ENT, in the meantime we can start her on codeine with guaifenesin for treatment of cough. Please confirm and we'll print a prescription

## 2016-09-18 ENCOUNTER — Ambulatory Visit (INDEPENDENT_AMBULATORY_CARE_PROVIDER_SITE_OTHER): Payer: Managed Care, Other (non HMO) | Admitting: Family Medicine

## 2016-09-18 ENCOUNTER — Encounter: Payer: Self-pay | Admitting: Family Medicine

## 2016-09-18 ENCOUNTER — Other Ambulatory Visit: Payer: Self-pay | Admitting: Family Medicine

## 2016-09-18 VITALS — BP 120/77 | HR 84 | Temp 98.4°F | Resp 16 | Ht 65.0 in | Wt 214.7 lb

## 2016-09-18 DIAGNOSIS — Z1211 Encounter for screening for malignant neoplasm of colon: Secondary | ICD-10-CM

## 2016-09-18 DIAGNOSIS — Z780 Asymptomatic menopausal state: Secondary | ICD-10-CM

## 2016-09-18 DIAGNOSIS — M25572 Pain in left ankle and joints of left foot: Secondary | ICD-10-CM

## 2016-09-18 DIAGNOSIS — M1711 Unilateral primary osteoarthritis, right knee: Secondary | ICD-10-CM

## 2016-09-18 DIAGNOSIS — Z0001 Encounter for general adult medical examination with abnormal findings: Secondary | ICD-10-CM

## 2016-09-18 DIAGNOSIS — Z1239 Encounter for other screening for malignant neoplasm of breast: Secondary | ICD-10-CM

## 2016-09-18 DIAGNOSIS — Z124 Encounter for screening for malignant neoplasm of cervix: Secondary | ICD-10-CM

## 2016-09-18 DIAGNOSIS — Z01419 Encounter for gynecological examination (general) (routine) without abnormal findings: Secondary | ICD-10-CM

## 2016-09-18 DIAGNOSIS — M25472 Effusion, left ankle: Secondary | ICD-10-CM | POA: Diagnosis not present

## 2016-09-18 DIAGNOSIS — Z1159 Encounter for screening for other viral diseases: Secondary | ICD-10-CM

## 2016-09-18 MED ORDER — TRAMADOL HCL 50 MG PO TABS: 50.0000 mg | ORAL_TABLET | Freq: Every evening | ORAL | 0 refills | Status: AC | PRN

## 2016-09-18 NOTE — Progress Notes (Signed)
Name: Tiffany Whitaker   MRN: [id number]    DOB: 10/03/1965   Date:09/18/2016       Progress Note  Subjective  Chief Complaint  Chief Complaint  Patient presents with  . Annual Exam    CPE w/pap    HPI  Patient presents for Complete Physical Exam. Last Pap Smear as reportedly in December 2015, records are not available.  She is due for colon cancer screening. She is due for breast cancer screening.    Past Medical History:  Diagnosis Date  . Allergy   . Anemia   . Anxiety     Past Surgical History:  Procedure Laterality Date  . ABDOMINAL HYSTERECTOMY    . CESAREAN SECTION  1997  . GASTRIC BYPASS  2001    Family History  Problem Relation Age of Onset  . Hypertension Mother   . Multiple sclerosis Mother   . Hypertension Father   . Cancer Father   . Multiple sclerosis Sister   . Hypertension Sister   . Hypertension Brother     Social History   Social History  . Marital status: Married    Spouse name: N/A  . Number of children: N/A  . Years of education: N/A   Occupational History  . Not on file.   Social History Main Topics  . Smoking status: Never Smoker  . Smokeless tobacco: Never Used  . Alcohol use No  . Drug use: No  . Sexual activity: Yes    Partners: Male    Birth control/ protection: None     Comment: Married   Other Topics Concern  . Not on file   Social History Narrative  . No narrative on file     Current Outpatient Prescriptions:  .  benzonatate (TESSALON) 100 MG capsule, Take 1 capsule (100 mg total) by mouth 3 (three) times daily., Disp: 21 capsule, Rfl: 0 .  Pediatric Multiple Vitamins (CHEWABLE MULTIPLE VITAMINS PO), Take by mouth., Disp: , Rfl:  .  traMADol (ULTRAM) 50 MG tablet, Take 1 tablet (50 mg total) by mouth at bedtime as needed., Disp: 30 tablet, Rfl: 0  Allergies  Allergen Reactions  . Aspirin Hives, Nausea And Vomiting and Swelling  . Other Hives and Swelling    "certain type of balloons"     Review of  Systems  Constitutional: Negative for chills, fever and malaise/fatigue.  HENT: Negative for congestion, ear pain and sore throat.   Eyes: Negative for blurred vision and double vision.  Respiratory: Positive for cough and sputum production. Negative for wheezing.   Cardiovascular: Negative for chest pain, palpitations and leg swelling.  Gastrointestinal: Negative for abdominal pain, blood in stool, constipation, nausea and vomiting.  Genitourinary: Negative for dysuria and hematuria.  Musculoskeletal: Negative for back pain and neck pain.  Neurological: Negative for dizziness and headaches.  Psychiatric/Behavioral: Negative for depression. The patient is not nervous/anxious and does not have insomnia.       Objective  Vitals:   09/18/16 1407  BP: 120/77  Pulse: 84  Resp: 16  Temp: 98.4 F (36.9 C)  TempSrc: Oral  SpO2: 98%  Weight: 214 lb 11.2 oz (97.4 kg)  Height: 5\' 5"  (1.651 m)    Physical Exam  Constitutional: She is oriented to person, place, and time and well-developed, well-nourished, and in no distress.  HENT:  Head: Normocephalic and atraumatic.  Right Ear: External ear normal.  Left Ear: External ear normal.  Cardiovascular: Normal rate, regular rhythm and normal heart sounds.  No murmur heard. Pulmonary/Chest: Effort normal and breath sounds normal. She has no wheezes.  Abdominal: Soft. Bowel sounds are normal. There is no tenderness.  Genitourinary: Vagina normal, uterus normal, cervix normal, right adnexa normal and left adnexa normal.  Musculoskeletal:       Left ankle: She exhibits swelling. Tenderness. Lateral malleolus tenderness found.       Feet:  Neurological: She is alert and oriented to person, place, and time.  Psychiatric: Mood, memory, affect and judgment normal.  Nursing note and vitals reviewed.    Assessment & Plan  1. Well woman exam with routine gynecological exam Obtain age-appropriate laboratory testing - CBC with  Differential/Platelet - COMPLETE METABOLIC PANEL WITH GFR - Lipid panel - TSH - VITAMIN D 25 Hydroxy (Vit-D Deficiency, Fractures)  2. Arthritis of right knee Pain is controlled with tramadol taken every day as needed, refills provided - traMADol (ULTRAM) 50 MG tablet; Take 1 tablet (50 mg total) by mouth at bedtime as needed.  Dispense: 30 tablet; Refill: 0  3. Screening for breast cancer  - MM Digital Screening; Future  4. Screening for colon cancer  - Cologuard  5. Screening for cervical cancer  - Pap IG and HPV (high risk) DNA detection  6. Post-menopausal  - DG Bone Density; Future  7. Pain and swelling of left ankle Obtain x-ray of left ankle for evaluation - DG Ankle Complete Left; Future  8. Need for hepatitis C screening test  - Hepatitis C antibody   Mansel Strother Asad A. Faylene KurtzShah [facility] 09/18/2016 2:41 PM

## 2016-09-19 LAB — PAP IG AND HPV HIGH-RISK: HPV DNA HIGH RISK: NOT DETECTED

## 2016-09-20 ENCOUNTER — Other Ambulatory Visit: Payer: Self-pay | Admitting: Family Medicine

## 2016-09-20 ENCOUNTER — Ambulatory Visit
Admission: RE | Admit: 2016-09-20 | Discharge: 2016-09-20 | Disposition: A | Discharge status: Home / Self Care | Payer: Managed Care, Other (non HMO) | Source: Ambulatory Visit | Attending: Family Medicine | Admitting: Family Medicine

## 2016-09-20 DIAGNOSIS — M25472 Effusion, left ankle: Secondary | ICD-10-CM

## 2016-09-20 DIAGNOSIS — A599 Trichomoniasis, unspecified: Secondary | ICD-10-CM | POA: Insufficient documentation

## 2016-09-20 DIAGNOSIS — M25572 Pain in left ankle and joints of left foot: Secondary | ICD-10-CM | POA: Diagnosis present

## 2016-09-20 LAB — CBC WITH DIFFERENTIAL/PLATELET
BASOS ABS: 0 {cells}/uL (ref 0–200)
Basophils Relative: 0 %
Eosinophils Absolute: 118 cells/uL (ref 15–500)
Eosinophils Relative: 2 %
HEMATOCRIT: 36.6 % (ref 35.0–45.0)
HEMOGLOBIN: 11.8 g/dL (ref 11.7–15.5)
Lymphocytes Relative: 44 %
Lymphs Abs: 2596 cells/uL (ref 850–3900)
MCH: 29 pg (ref 27.0–33.0)
MCHC: 32.2 g/dL (ref 32.0–36.0)
MCV: 89.9 fL (ref 80.0–100.0)
MPV: 10.2 fL (ref 7.5–12.5)
Monocytes Absolute: 472 cells/uL (ref 200–950)
Monocytes Relative: 8 %
NEUTROS ABS: 2714 {cells}/uL (ref 1500–7800)
NEUTROS PCT: 46 %
Platelets: 295 10*3/uL (ref 140–400)
RBC: 4.07 MIL/uL (ref 3.80–5.10)
RDW: 14.1 % (ref 11.0–15.0)
WBC: 5.9 10*3/uL (ref 3.8–10.8)

## 2016-09-20 LAB — TSH: TSH: 1.42 m[IU]/L

## 2016-09-20 MED ORDER — METRONIDAZOLE 500 MG PO TABS: 500.0000 mg | ORAL_TABLET | Freq: Two times a day (BID) | ORAL | 0 refills | Status: AC

## 2016-09-20 NOTE — Assessment & Plan Note (Signed)
Treated May 2018

## 2016-09-20 NOTE — Telephone Encounter (Signed)
Explained diagnosis to patient Explained considered STI Husband will need to be treated as well She says he will contact his own doctor and get medicine If both she and her husband are not treated, they can pass back and forth Offered other testing; she declined

## 2016-09-20 NOTE — Telephone Encounter (Signed)
I am covering for colleague; patient has trich on pap I called, left message asking her to call back about test results I have Rx that I will send when I speak with her

## 2016-09-21 LAB — COMPLETE METABOLIC PANEL WITH GFR
ALBUMIN: 3.6 g/dL (ref 3.6–5.1)
ALK PHOS: 87 U/L (ref 33–130)
ALT: 14 U/L (ref 6–29)
AST: 15 U/L (ref 10–35)
BUN: 13 mg/dL (ref 7–25)
CALCIUM: 8.9 mg/dL (ref 8.6–10.4)
CO2: 25 mmol/L (ref 20–31)
Chloride: 105 mmol/L (ref 98–110)
Creat: 0.47 mg/dL — ABNORMAL LOW (ref 0.50–1.05)
GFR, Est African American: >89 mL/min (ref >=60)
GFR, Est Non African American: >89 mL/min (ref >=60)
GLUCOSE: 86 mg/dL (ref 65–99)
POTASSIUM: 4.1 mmol/L (ref 3.5–5.3)
Sodium: 141 mmol/L (ref 135–146)
Total Bilirubin: 0.5 mg/dL (ref 0.2–1.2)
Total Protein: 5.9 g/dL — ABNORMAL LOW (ref 6.1–8.1)

## 2016-09-21 LAB — VITAMIN D 25 HYDROXY (VIT D DEFICIENCY, FRACTURES): VIT D 25 HYDROXY: 26 ng/mL — AB (ref 30–100)

## 2016-09-21 LAB — HEPATITIS C ANTIBODY: HCV AB: NEGATIVE

## 2016-09-21 LAB — LIPID PANEL
CHOL/HDL RATIO: 2.4 ratio (ref <5.0)
CHOLESTEROL: 149 mg/dL (ref <200)
HDL: 62 mg/dL (ref >50)
LDL Cholesterol: 77 mg/dL (ref <100)
Triglycerides: 48 mg/dL (ref <150)
VLDL: 10 mg/dL (ref <30)

## 2016-09-25 ENCOUNTER — Telehealth: Payer: Self-pay

## 2016-09-25 ENCOUNTER — Ambulatory Visit: Payer: Managed Care, Other (non HMO) | Admitting: Family Medicine

## 2016-09-25 MED ORDER — VITAMIN D (ERGOCALCIFEROL) 1.25 MG (50000 UNIT) PO CAPS: 50000.0000 [IU] | ORAL_CAPSULE | ORAL | 0 refills | Status: AC

## 2016-09-25 NOTE — Telephone Encounter (Signed)
Patient has been notified of lab results and a prescription for vitamin D3 50,000 units take 1 capsule by mouth once a week x12 weeks [facility] [place] [facility] Dr. Sherryll BurgerShah, patient has been notified and verbalized understanding

## 2016-11-12 ENCOUNTER — Ambulatory Visit
Admission: RE | Admit: 2016-11-12 | Discharge: 2016-11-12 | Disposition: A | Discharge status: Home / Self Care | Payer: Managed Care, Other (non HMO) | Source: Ambulatory Visit | Attending: Family Medicine | Admitting: Family Medicine

## 2016-11-12 DIAGNOSIS — Z780 Asymptomatic menopausal state: Secondary | ICD-10-CM | POA: Insufficient documentation

## 2016-11-12 DIAGNOSIS — Z1231 Encounter for screening mammogram for malignant neoplasm of breast: Secondary | ICD-10-CM | POA: Insufficient documentation

## 2016-11-12 DIAGNOSIS — Z13820 Encounter for screening for osteoporosis: Secondary | ICD-10-CM | POA: Diagnosis present

## 2016-11-12 DIAGNOSIS — Z1239 Encounter for other screening for malignant neoplasm of breast: Secondary | ICD-10-CM

## 2016-11-18 ENCOUNTER — Inpatient Hospital Stay
Admission: RE | Admit: 2016-11-18 | Discharge: 2016-11-18 | Disposition: A | Discharge status: Home / Self Care | Payer: Self-pay | Source: Ambulatory Visit | Attending: *Deleted | Admitting: *Deleted

## 2016-11-18 ENCOUNTER — Other Ambulatory Visit: Payer: Self-pay | Admitting: *Deleted

## 2016-11-18 DIAGNOSIS — Z9289 Personal history of other medical treatment: Secondary | ICD-10-CM

## 2016-11-29 ENCOUNTER — Other Ambulatory Visit: Payer: Self-pay | Admitting: Family Medicine

## 2016-11-29 DIAGNOSIS — M1711 Unilateral primary osteoarthritis, right knee: Secondary | ICD-10-CM

## 2016-11-29 NOTE — Telephone Encounter (Signed)
PT NEEDS REFILL ON TRAMADOL. PHARM IS [facility] [place]

## 2016-12-04 ENCOUNTER — Other Ambulatory Visit: Payer: Self-pay | Admitting: Family Medicine

## 2016-12-04 DIAGNOSIS — M1711 Unilateral primary osteoarthritis, right knee: Secondary | ICD-10-CM

## 2016-12-18 ENCOUNTER — Ambulatory Visit: Payer: Managed Care, Other (non HMO) | Admitting: Family Medicine

## 2016-12-19 ENCOUNTER — Ambulatory Visit: Payer: Managed Care, Other (non HMO) | Admitting: Family Medicine

## 2018-08-08 MED ORDER — ONDANSETRON HCL 4 MG/2ML IJ SOLN
4.00 | INTRAMUSCULAR | Status: DC
Start: ? — End: 2018-08-08

## 2018-08-08 MED ORDER — DOCUSATE SODIUM 100 MG PO CAPS
100.00 | ORAL_CAPSULE | ORAL | Status: DC
Start: 2018-08-09 — End: 2018-08-08

## 2018-08-08 MED ORDER — GENERIC EXTERNAL MEDICATION
Status: DC
Start: ? — End: 2018-08-08

## 2018-08-08 MED ORDER — HEPARIN SODIUM (PORCINE) 5000 UNIT/ML IJ SOLN
5000.00 | INTRAMUSCULAR | Status: DC
Start: 2018-08-08 — End: 2018-08-08

## 2018-08-08 MED ORDER — AMOXICILLIN-POT CLAVULANATE 875-125 MG PO TABS
1.00 | ORAL_TABLET | ORAL | Status: DC
Start: 2018-08-08 — End: 2018-08-08

## 2018-08-08 MED ORDER — ACETAMINOPHEN 325 MG PO TABS
650.00 | ORAL_TABLET | ORAL | Status: DC
Start: ? — End: 2018-08-08

## 2019-03-22 IMAGING — CR DG ANKLE COMPLETE 3+V*L*
1 series · 3 of 3 positions shown · non-contrast
Comparison: None.

CLINICAL DATA: Pain and swelling to left ankle

EXAM:
LEFT ANKLE COMPLETE - 3+ VIEW

[Series 1: dg ankle complete left · 0.14mm/px · 3 of 3 slices shown]
[im 1/3]
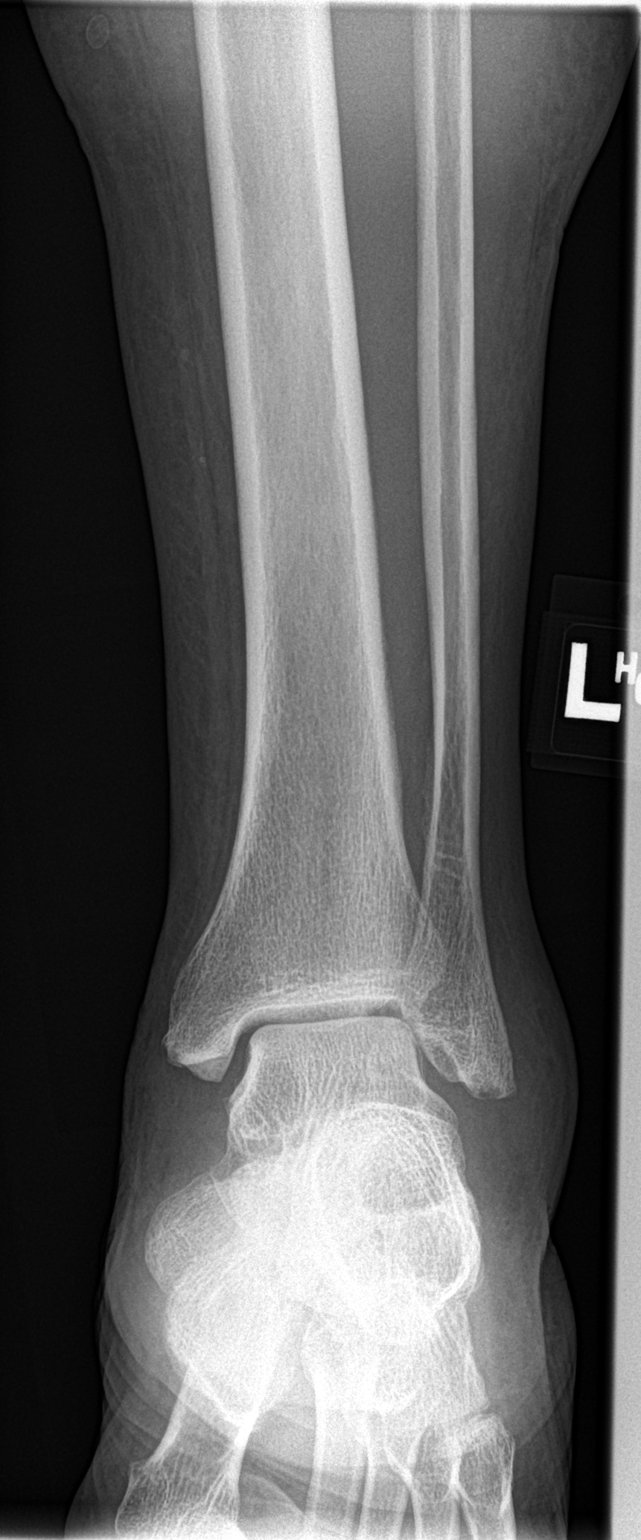
[im 2/3]
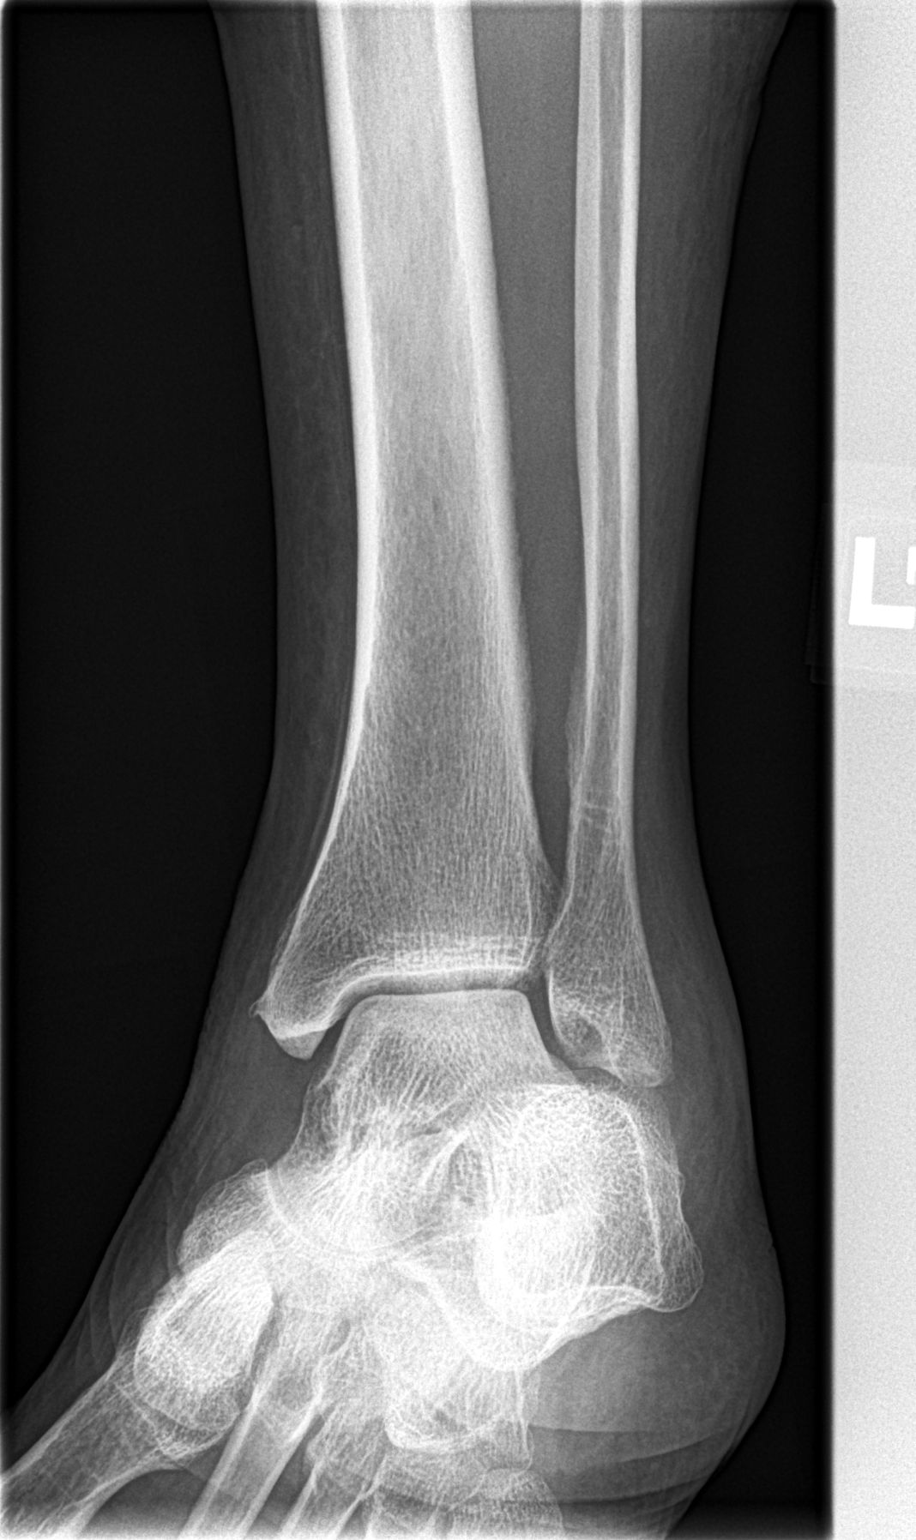
[im 3/3]
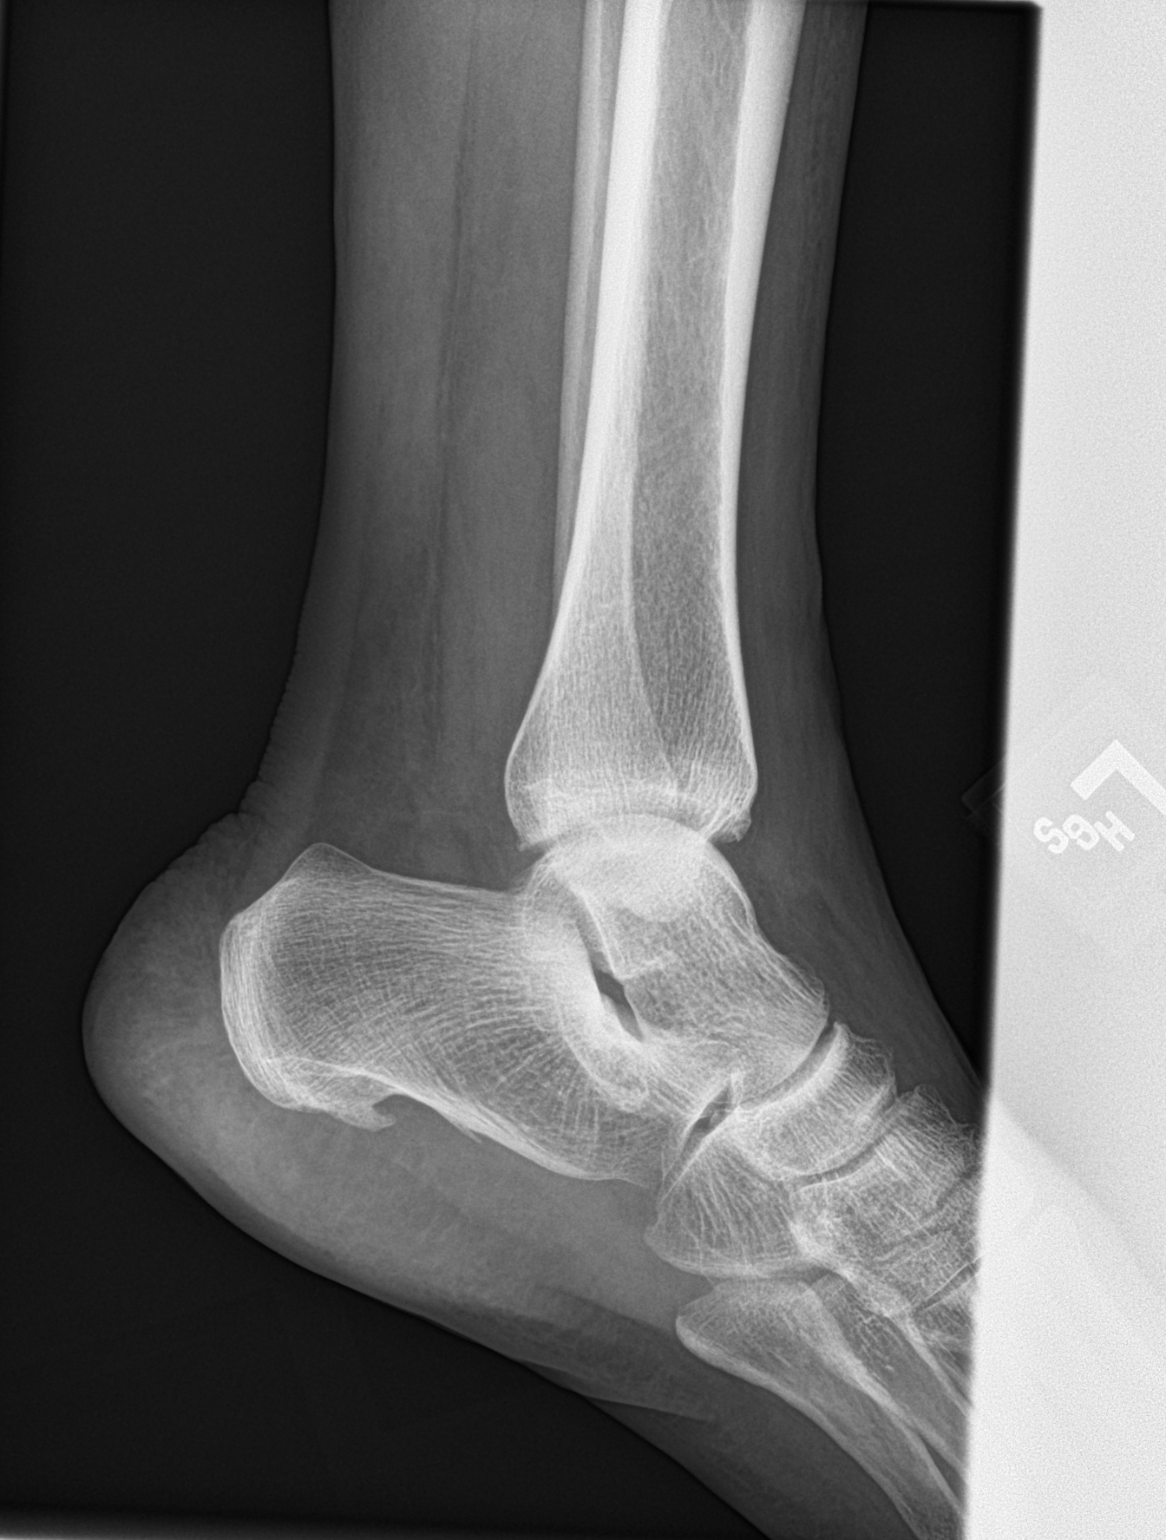

[3 of 3 positions shown; findings below may reference images not displayed]

FINDINGS: There is diffuse soft tissue swelling. There is no underlying
fracture or subluxation identified. Small plantar heel spur
identified.
IMPRESSION: 1. Diffuse soft tissue swelling.
